# Patient Record
Sex: Female | Born: 1990 | Race: Black or African American | Hispanic: No | Marital: Single | Smoking: Never smoker
Health system: Southern US, Community
[De-identification: ages and names within clinical notes are randomized; demographics above are authoritative.]

## PROBLEM LIST (undated history)

## (undated) DIAGNOSIS — IMO0002 Reserved for concepts with insufficient information to code with codable children: Secondary | ICD-10-CM

## (undated) DIAGNOSIS — Z789 Other specified health status: Secondary | ICD-10-CM

## (undated) HISTORY — DX: Reserved for concepts with insufficient information to code with codable children: IMO0002

## (undated) HISTORY — PX: LEEP: SHX91

---
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
## 2011-07-23 ENCOUNTER — Emergency Department (HOSPITAL_COMMUNITY)
Admission: EM | Admit: 2011-07-23 | Discharge: 2011-07-23 | Disposition: A | Discharge status: Home / Self Care | Payer: BC Managed Care – PPO | Attending: Emergency Medicine | Admitting: Emergency Medicine

## 2011-07-23 DIAGNOSIS — N898 Other specified noninflammatory disorders of vagina: Secondary | ICD-10-CM | POA: Insufficient documentation

## 2011-07-23 DIAGNOSIS — R42 Dizziness and giddiness: Secondary | ICD-10-CM | POA: Insufficient documentation

## 2011-07-23 LAB — WET PREP, GENITAL
Trich, Wet Prep: NONE SEEN
WBC, Wet Prep HPF POC: NONE SEEN
Yeast Wet Prep HPF POC: NONE SEEN

## 2011-07-23 LAB — POCT I-STAT, CHEM 8
Calcium, Ion: 1.26 mmol/L (ref 1.12–1.32)
Chloride: 103 mEq/L (ref 96–112)
Glucose, Bld: 83 mg/dL (ref 70–99)
HCT: 39 % (ref 36.0–46.0)
Hemoglobin: 13.3 g/dL (ref 12.0–15.0)
TCO2: 25 mmol/L (ref 0–100)

## 2011-07-23 LAB — POCT PREGNANCY, URINE: Preg Test, Ur: NEGATIVE

## 2011-08-17 ENCOUNTER — Encounter (HOSPITAL_COMMUNITY): Payer: Self-pay | Admitting: *Deleted

## 2011-08-17 ENCOUNTER — Inpatient Hospital Stay (HOSPITAL_COMMUNITY)
Admission: AD | Admit: 2011-08-17 | Discharge: 2011-08-17 | Disposition: A | Discharge status: Home / Self Care | Payer: BC Managed Care – PPO | Source: Ambulatory Visit | Attending: Obstetrics & Gynecology | Admitting: Obstetrics & Gynecology

## 2011-08-17 DIAGNOSIS — N949 Unspecified condition associated with female genital organs and menstrual cycle: Secondary | ICD-10-CM | POA: Insufficient documentation

## 2011-08-17 DIAGNOSIS — N760 Acute vaginitis: Secondary | ICD-10-CM | POA: Insufficient documentation

## 2011-08-17 DIAGNOSIS — A499 Bacterial infection, unspecified: Secondary | ICD-10-CM

## 2011-08-17 DIAGNOSIS — B9689 Other specified bacterial agents as the cause of diseases classified elsewhere: Secondary | ICD-10-CM | POA: Insufficient documentation

## 2011-08-17 DIAGNOSIS — N939 Abnormal uterine and vaginal bleeding, unspecified: Secondary | ICD-10-CM

## 2011-08-17 DIAGNOSIS — N938 Other specified abnormal uterine and vaginal bleeding: Secondary | ICD-10-CM | POA: Insufficient documentation

## 2011-08-17 HISTORY — DX: Other specified health status: Z78.9

## 2011-08-17 LAB — CBC
HCT: 36.7 % (ref 36.0–46.0)
MCH: 26.8 pg (ref 26.0–34.0)
MCV: 83.4 fL (ref 78.0–100.0)
Platelets: 297 10*3/uL (ref 150–400)
RDW: 13.2 % (ref 11.5–15.5)
WBC: 6.5 10*3/uL (ref 4.0–10.5)

## 2011-08-17 LAB — WET PREP, GENITAL

## 2011-08-17 MED ORDER — NORGESTIMATE-ETH ESTRADIOL 0.25-35 MG-MCG PO TABS: 1.0000 | ORAL_TABLET | Freq: Every day | ORAL | Status: DC

## 2011-08-17 MED ORDER — METRONIDAZOLE 500 MG PO TABS: 500.0000 mg | ORAL_TABLET | Freq: Two times a day (BID) | ORAL | Status: AC

## 2011-08-17 NOTE — Plan of Care (Signed)
Patient is not in the lobby when called to triage.  

## 2011-08-17 NOTE — ED Provider Notes (Signed)
History     Chief Complaint  Patient presents with  . Vaginal Bleeding   HPI 20 y.o. G0P0 with vaginal bleeding x 3 months. Has Implanon since 2010, was having 2 cycles per year. Has occasional cramping, no pain today. Was evaluated for the same complaint 1 month ago at [facility].    Past Medical History  Diagnosis Date  . No pertinent past medical history     Past Surgical History  Procedure Date  . Leep     2008    No family history on file.  History  Substance Use Topics  . Smoking status: Never Smoker   . Smokeless tobacco: Not on file  . Alcohol Use: No    Allergies: No Known Allergies  No prescriptions prior to admission    Review of Systems  Constitutional: Negative.   Respiratory: Negative.   Cardiovascular: Negative.   Gastrointestinal: Negative for nausea, vomiting, abdominal pain, diarrhea and constipation.  Genitourinary: Negative for dysuria, urgency, frequency, hematuria and flank pain.       Negative for vaginal discharge, + for bleeding   Musculoskeletal: Negative.   Neurological: Negative.   Psychiatric/Behavioral: Negative.    Physical Exam   Blood pressure 120/67, pulse 82, temperature 98.7 F (37.1 C), temperature source Oral, resp. rate 16, height 5' 10.5" (1.791 m), weight 67.586 kg (149 lb), last menstrual period 05/16/2011, SpO2 99.00%.  Physical Exam  Constitutional: She is oriented to person, place, and time. She appears well-developed and well-nourished. No distress.  HENT:  Head: Normocephalic and atraumatic.  Cardiovascular: Normal rate, regular rhythm and normal heart sounds.   Respiratory: Effort normal and breath sounds normal. No respiratory distress.  GI: Soft. Bowel sounds are normal. She exhibits no distension and no mass. There is no tenderness. There is no rebound and no guarding.  Genitourinary: There is no rash or lesion on the right labia. There is no rash or lesion on the left labia. Uterus is not deviated, not enlarged,  not fixed and not tender. Cervix exhibits no motion tenderness, no discharge and no friability. Right adnexum displays no mass, no tenderness and no fullness. Left adnexum displays no mass, no tenderness and no fullness. There is bleeding (moderate) around the vagina. No erythema or tenderness around the vagina. Vaginal discharge (malodorous) found.  Neurological: She is alert and oriented to person, place, and time.  Skin: Skin is warm and dry.  Psychiatric: She has a normal mood and affect.    MAU Course  Procedures  Results for orders placed during the hospital encounter of 08/17/11 (from the past 24 hour(s))  CBC     Status: Abnormal   Collection Time   08/17/11  2:31 PM      Component Value Range   WBC 6.5  4.0 - 10.5 (K/uL)   RBC 4.40  3.87 - 5.11 (MIL/uL)   Hemoglobin 11.8 (*) 12.0 - 15.0 (g/dL)   HCT 16.1  09.6 - 04.5 (%)   MCV 83.4  78.0 - 100.0 (fL)   MCH 26.8  26.0 - 34.0 (pg)   MCHC 32.2  30.0 - 36.0 (g/dL)   RDW 40.9  81.1 - 91.4 (%)   Platelets 297  150 - 400 (K/uL)  WET PREP, GENITAL     Status: Abnormal   Collection Time   08/17/11  3:44 PM      Component Value Range   Yeast, Wet Prep NONE SEEN  NONE SEEN    Trich, Wet Prep NONE SEEN  NONE  SEEN    Clue Cells, Wet Prep MODERATE (*) NONE SEEN    WBC, Wet Prep HPF POC FEW (*) NONE SEEN      Assessment and Plan  20 y.o. G0P0  DUB - possibly related to Implanon - recommended f/u with [facility] (where she had implanon placed) to discuss possible change in birth control, discussed Mirena BV - rx Flagyl  Stryder Poitra 08/17/2011, 4:45 PM

## 2011-08-17 NOTE — Progress Notes (Signed)
Patient states she has had a Implanon since 2010. Has about 2 cycles per year. Started bleeding on 8-11 and has had bleeding every day since. Has started having off and on abdominal cramping, none at this time.

## 2011-08-20 NOTE — ED Provider Notes (Signed)
Agree with above note.  Suzanne Ober H. 08/20/2011 1:11 PM  

## 2011-12-02 ENCOUNTER — Other Ambulatory Visit: Payer: Self-pay | Admitting: Orthopedic Surgery

## 2011-12-02 DIAGNOSIS — M898X6 Other specified disorders of bone, lower leg: Secondary | ICD-10-CM

## 2011-12-03 ENCOUNTER — Ambulatory Visit
Admission: RE | Admit: 2011-12-03 | Discharge: 2011-12-03 | Disposition: A | Discharge status: Home / Self Care | Payer: BC Managed Care – PPO | Source: Ambulatory Visit | Attending: Orthopedic Surgery | Admitting: Orthopedic Surgery

## 2011-12-03 DIAGNOSIS — M898X6 Other specified disorders of bone, lower leg: Secondary | ICD-10-CM

## 2012-07-12 ENCOUNTER — Encounter: Payer: BC Managed Care – PPO | Admitting: Family Medicine

## 2012-07-13 ENCOUNTER — Encounter (HOSPITAL_COMMUNITY): Payer: Self-pay | Admitting: *Deleted

## 2012-07-13 ENCOUNTER — Inpatient Hospital Stay (HOSPITAL_COMMUNITY)
Admission: AD | Admit: 2012-07-13 | Discharge: 2012-07-13 | Disposition: A | Discharge status: Home / Self Care | Payer: BC Managed Care – PPO | Source: Ambulatory Visit | Attending: Obstetrics & Gynecology | Admitting: Obstetrics & Gynecology

## 2012-07-13 DIAGNOSIS — B379 Candidiasis, unspecified: Secondary | ICD-10-CM

## 2012-07-13 DIAGNOSIS — L293 Anogenital pruritus, unspecified: Secondary | ICD-10-CM | POA: Insufficient documentation

## 2012-07-13 DIAGNOSIS — IMO0002 Reserved for concepts with insufficient information to code with codable children: Secondary | ICD-10-CM

## 2012-07-13 DIAGNOSIS — B373 Candidiasis of vulva and vagina: Secondary | ICD-10-CM | POA: Insufficient documentation

## 2012-07-13 DIAGNOSIS — B9689 Other specified bacterial agents as the cause of diseases classified elsewhere: Secondary | ICD-10-CM | POA: Insufficient documentation

## 2012-07-13 DIAGNOSIS — B3731 Acute candidiasis of vulva and vagina: Secondary | ICD-10-CM | POA: Insufficient documentation

## 2012-07-13 DIAGNOSIS — A499 Bacterial infection, unspecified: Secondary | ICD-10-CM | POA: Insufficient documentation

## 2012-07-13 DIAGNOSIS — N760 Acute vaginitis: Secondary | ICD-10-CM | POA: Insufficient documentation

## 2012-07-13 LAB — WET PREP, GENITAL: Trich, Wet Prep: NONE SEEN

## 2012-07-13 MED ORDER — VALACYCLOVIR HCL 1 G PO TABS: 1000.0000 mg | ORAL_TABLET | Freq: Two times a day (BID) | ORAL | Status: AC

## 2012-07-13 MED ORDER — METRONIDAZOLE 500 MG PO TABS: 500.0000 mg | ORAL_TABLET | Freq: Two times a day (BID) | ORAL | Status: DC

## 2012-07-13 MED ORDER — HYDROCODONE-ACETAMINOPHEN 5-325 MG PO TABS: 1.0000 | ORAL_TABLET | ORAL | Status: DC | PRN

## 2012-07-13 MED ORDER — FLUCONAZOLE 150 MG PO TABS
150.0000 mg | ORAL_TABLET | Freq: Once | ORAL | Status: AC
  Administered 2012-07-13: 150 mg via ORAL
  Filled 2012-07-13: qty 1

## 2012-07-13 NOTE — MAU Provider Note (Signed)
History     CSN: [id number]  Arrival date and time: 07/13/12 7829   First Provider Initiated Contact with Patient 07/13/12 0102      Chief Complaint  Patient presents with  . Vaginal Itching   HPI Suzanne Barrett is a 21 y.o. female who presents to MAU with vaginal pain. The pain started 3 days ago. Patient noted lesions on buttocks and then spread to labia. Started with itching and now burning when voids. Regular visits @ [facility] office. Scheduled for Implanon removal next week. Taking OC's for abnormal bleeding. No new sex partners. Patient states that she changed from [employer] soap to a [employer] that someone gave her. The lesions started after that. The history was provided by the patient.  OB History    Grav Para Term Preterm Abortions TAB SAB Ect Mult Living   0               Past Medical History  Diagnosis Date  . No pertinent past medical history     Past Surgical History  Procedure Date  . Leep     2008    History reviewed. No pertinent family history.  History  Substance Use Topics  . Smoking status: Never Smoker   . Smokeless tobacco: Not on file  . Alcohol Use: No    Allergies: No Known Allergies  No prescriptions prior to admission    Review of Systems  Constitutional: Negative for fever, chills and weight loss.  HENT: Negative for ear pain, nosebleeds, congestion, sore throat and neck pain.   Eyes: Negative for blurred vision, double vision, photophobia and pain.  Respiratory: Negative for cough, shortness of breath and wheezing.   Cardiovascular: Negative for chest pain, palpitations and leg swelling.  Gastrointestinal: Negative for heartburn, nausea, vomiting, abdominal pain, diarrhea and constipation.  Genitourinary: Positive for dysuria. Negative for urgency and frequency.       Genital lesions  Musculoskeletal: Negative for myalgias and back pain.  Skin: Negative for itching and rash.  Neurological: Negative for dizziness,  sensory change, speech change, seizures, weakness and headaches.  Endo/Heme/Allergies: Does not bruise/bleed easily.  Psychiatric/Behavioral: Negative for depression. The patient is not nervous/anxious.    Physical Exam   Blood pressure 121/67, pulse 88, temperature 99 F (37.2 C), temperature source Oral, resp. rate 16, height 5\' 9"  (1.753 m), weight 163 lb (73.936 kg), last menstrual period 11/22/2011, SpO2 100.00%.  Physical Exam  Nursing note and vitals reviewed. Constitutional: She is oriented to person, place, and time. She appears well-developed and well-nourished. No distress.  HENT:  Head: Normocephalic and atraumatic.  Eyes: EOM are normal.  Neck: Neck supple.  Cardiovascular: Normal rate.   Respiratory: Effort normal.  GI: Soft. There is no tenderness.  Genitourinary:       External genitalia with lesions noted on mucous membrane of left labia minor and folds of buttocks. Thick white malodorous vaginal discharge.  Musculoskeletal: Normal range of motion.  Neurological: She is alert and oriented to person, place, and time.  Skin: Skin is warm and dry.  Psychiatric: She has a normal mood and affect. Her behavior is normal. Judgment and thought content normal.   Wet prep = Moderate Clue cells   Assessment: 21 y.o. 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 She will return if any problems arise.   Medication List     As of 07/18/2012  1:17 AM    START taking these medications         HYDROcodone-acetaminophen 5-325 MG per tablet   Commonly known as: NORCO/VICODIN   Take 1 tablet by mouth every 4 (four) hours as needed for pain.      metroNIDAZOLE 500 MG tablet   Commonly known as: FLAGYL   Take 1  tablet (500 mg total) by mouth 2 (two) times daily.      valACYclovir 1000 MG tablet   Commonly known as: VALTREX   Take 1 tablet (1,000 mg total) by mouth 2 (two) times daily.      CONTINUE taking these medications         doxycycline 40 MG capsule   Commonly known as: ORACEA      norgestimate-ethinyl estradiol 0.25-35 MG-MCG tablet   Commonly known as: ORTHO-CYCLEN,SPRINTEC,PREVIFEM   Take 1 tablet by mouth daily. 1 tab tid x 3 days, then 1 tab bid x 3 days, then 1 po daily      ZICAM COLD REMEDY PO          Where to get your medications    These are the prescriptions that you need to pick up.   You may get these medications from any pharmacy.         HYDROcodone-acetaminophen 5-325 MG per tablet   metroNIDAZOLE 500 MG tablet   valACYclovir 1000 MG tablet           Letrell Attwood, RN, FNP, BC 07/18/2012, 1:10 AM

## 2012-07-13 NOTE — MAU Note (Signed)
Pt reports her genital area is very irritated and itchy, states she has now noticed some "bumps", very painful to touch.denies discharge

## 2012-07-13 NOTE — MAU Note (Signed)
PT SAYS SHE IS USING 2 TYPES OF BC- TO GET HER VAG BLEEDING UNDER CONTROL-   GETS CARE AT CLINIC BUT HAS AN APPOINTMENT AT [facility]  ON 07-19-2012.Marland Kitchen TAKES DOXYCYCLINE FOR BV.    PT NOTICED  BUMPS  ON BUTTOCKS CHEEK ON  Sunday AND IT SPREAD TO LABIA-  HAS BEEN USING  VICTORIA SECRET BATH WASH- NEW.   SAYS  ALL ITCHES AND BURNS WITH  URINATION.- NO BLEEDING-  NOT SCRATCHING IT.

## 2012-07-14 LAB — HERPES SIMPLEX VIRUS CULTURE
Culture: DETECTED
Special Requests: NORMAL

## 2012-07-14 LAB — GC/CHLAMYDIA PROBE AMP, GENITAL: Chlamydia, DNA Probe: NEGATIVE

## 2012-07-15 ENCOUNTER — Telehealth: Payer: Self-pay | Admitting: Advanced Practice Midwife

## 2012-07-15 NOTE — Telephone Encounter (Signed)
Called pt to inform her of positive HSV 1 culture on 07/13/12 in MAU.  Pt taking Valtrex prescribed for her at that visit in MAU. Pt given opportunity to ask questions and questions answered.

## 2012-07-19 ENCOUNTER — Encounter: Payer: BC Managed Care – PPO | Admitting: Obstetrics & Gynecology

## 2012-08-19 ENCOUNTER — Ambulatory Visit (INDEPENDENT_AMBULATORY_CARE_PROVIDER_SITE_OTHER): Payer: BC Managed Care – PPO | Admitting: Obstetrics & Gynecology

## 2012-08-19 ENCOUNTER — Encounter: Payer: Self-pay | Admitting: Obstetrics & Gynecology

## 2012-08-19 VITALS — BP 113/95 | HR 79 | Ht 68.0 in | Wt 162.0 lb

## 2012-08-19 DIAGNOSIS — A499 Bacterial infection, unspecified: Secondary | ICD-10-CM

## 2012-08-19 DIAGNOSIS — B9689 Other specified bacterial agents as the cause of diseases classified elsewhere: Secondary | ICD-10-CM

## 2012-08-19 DIAGNOSIS — Z30430 Encounter for insertion of intrauterine contraceptive device: Secondary | ICD-10-CM

## 2012-08-19 DIAGNOSIS — Z3046 Encounter for surveillance of implantable subdermal contraceptive: Secondary | ICD-10-CM

## 2012-08-19 DIAGNOSIS — Z3009 Encounter for other general counseling and advice on contraception: Secondary | ICD-10-CM

## 2012-08-19 DIAGNOSIS — N760 Acute vaginitis: Secondary | ICD-10-CM

## 2012-08-19 MED ORDER — LEVONORGESTREL 20 MCG/24HR IU IUD: 1.0000 | INTRAUTERINE_SYSTEM | Freq: Once | INTRAUTERINE | Status: DC

## 2012-08-19 NOTE — Progress Notes (Signed)
Patient is here to have her Implanon removed, this was placed 08/08/09.  She is interested in having a Mirena or Skyla placed. Patient has taken cytotec prior to arriving.

## 2012-08-19 NOTE — Progress Notes (Signed)
Suzanne Barrett is a 21 y.o. G0P0 here for Mirena IUD insertion and Implanon removal. No GYN concerns. .  IUD Procedure Note Patient identified, informed consent performed.  Discussed risks of irregular bleeding, cramping, infection, malpositioning or misplacement of the IUD outside the uterus which may require further procedures. Time out was performed.  Urine pregnancy test negative.  Speculum placed in the vagina. Copious thick, white discharge seen, wet prep obtained.  Cervix visualized.  Cleaned with Betadine x 2.   Grasped anteriorly with a single tooth tenaculum.  Dilated cervix with plastic dilators to accomodate the Mirena apparatus.  Uterus sounded to 8 cm.  Mirena IUD placed per manufacturer's recommendations, there was some difficulty given nulliparous cervix and uterus.  Strings trimmed to 3 cm. Tenaculum was removed, good hemostasis noted.  Patient had a lot of cramping; bedside ultrasound confirmed that IUD was in the endometrial cavity.  She was given Ibuprofen 800mg  po x 1, and post-procedure instructions.   Implanon Removal Patient given informed consent for removal of her Implanon.  Appropriate time out taken. Nexplanon site identified.  Area prepped in usual sterile fashon. 3 ml of 1% lidocaine was used to anesthetize the area at the distal end of the implant. A small stab incision was made right beside the implant on the distal portion.  The Nexplanon rod was grasped using hemostats and removed with some difficulty secondary to scar tissue around the Nexplanon.  There was small amount blood loss. There were no complications.  A small amount of antibiotic ointment and steri-strips were applied over the small incision.  A pressure bandage was applied to reduce any bruising.  The patient tolerated the procedure well and was given post procedure instructions.  Will follow up wet prep results and manage accordingly. Patient will follow up in 4 weeks for IUD check and for annual exam.  She was  given information about Gardasil vaccination.

## 2012-08-19 NOTE — Patient Instructions (Signed)
Intrauterine Device Insertion Care After Refer to this sheet in the next few weeks. These instructions provide you with information on caring for yourself after your procedure. Your caregiver may also give you more specific instructions. Your treatment has been planned according to current medical practices, but problems sometimes occur. Call your caregiver if you have any problems or questions after your procedure. HOME CARE INSTRUCTIONS   Only take over-the-counter or prescription medicines for pain, discomfort, or fever as directed by your caregiver. Do not use aspirin. This may increase bleeding.  Check your IUD to make sure it is in place before you resume sexual activity. You should be able to feel the strings. If you cannot feel the strings, something may be wrong. The IUD may have fallen out of the uterus, or the uterus may have been punctured (perforated) during placement. Also, if the strings are getting longer, it may mean that the IUD is being forced out of the uterus. You no longer have full protection from pregnancy if any of these problems occur.  You may resume sexual intercourse if you are not having problems with the IUD. The IUD is considered immediately effective.  You may resume normal activities.  Keep all follow-up appointments to be sure your IUD has remained in place. After the first exam, yearly exams are advised, unless you cannot feel the strings of your IUD.  Continue to check that the IUD is still in place by feeling for the strings after every menstrual period. SEEK MEDICAL CARE IF:   You have bleeding that is heavier or lasts longer than a normal menstrual cycle.  You have a fever.  You have increasing cramps or abdominal pain not relieved with medicine.  You have abdominal pain that does not seem to be related to the same area of earlier cramping and pain.  You are lightheaded, unusually weak, or faint.  You have abnormal vaginal discharge or  smells.  You have pain during sexual intercourse.  You cannot feel the IUD strings, or the IUD string has gotten longer.  You feel the IUD at the opening of the cervix in the vagina.  You think you are pregnant, or you miss your menstrual period.  The IUD string is hurting your sex partner. Document Released: 05/20/2011 Document Revised: 12/14/2011 Document Reviewed: 05/20/2011 Exit[employer] Patient Information 2013 [employer].  

## 2012-08-20 LAB — WET PREP, GENITAL: Yeast Wet Prep HPF POC: NONE SEEN

## 2012-08-22 MED ORDER — TINIDAZOLE 500 MG PO TABS: 2.0000 g | ORAL_TABLET | Freq: Every day | ORAL | Status: AC

## 2012-08-22 NOTE — Addendum Note (Signed)
Addended by: Jaynie Collins A on: 08/22/2012 02:45 PM   Modules accepted: Orders

## 2012-09-15 ENCOUNTER — Encounter: Payer: BC Managed Care – PPO | Admitting: Family Medicine

## 2012-09-15 NOTE — Progress Notes (Signed)
This encounter was created in error - please disregard.

## 2012-11-14 ENCOUNTER — Encounter: Payer: Self-pay | Admitting: Obstetrics & Gynecology

## 2012-11-14 ENCOUNTER — Ambulatory Visit (INDEPENDENT_AMBULATORY_CARE_PROVIDER_SITE_OTHER): Payer: BC Managed Care – PPO | Admitting: Obstetrics & Gynecology

## 2012-11-14 VITALS — BP 115/72 | HR 84 | Ht 68.0 in | Wt 169.0 lb

## 2012-11-14 DIAGNOSIS — N760 Acute vaginitis: Secondary | ICD-10-CM

## 2012-11-14 DIAGNOSIS — B9689 Other specified bacterial agents as the cause of diseases classified elsewhere: Secondary | ICD-10-CM

## 2012-11-14 DIAGNOSIS — A499 Bacterial infection, unspecified: Secondary | ICD-10-CM

## 2012-11-14 DIAGNOSIS — N898 Other specified noninflammatory disorders of vagina: Secondary | ICD-10-CM

## 2012-11-14 DIAGNOSIS — Z23 Encounter for immunization: Secondary | ICD-10-CM

## 2012-11-14 NOTE — Progress Notes (Signed)
History:  22 y.o. G0P0 here today for report of foul smelling vaginal discharge for a few weeks. No pruritus.  Thinks it is recurrent BV. On review of her records, she had positive tests for BV twice last year, last in 08/2012 and 07/2012 (also in 07/2011 and 07/2011).  Last episode was treated with Tinidazole.  She denies any other symptoms.  The following portions of the patient's history were reviewed and updated as appropriate: allergies, current medications, past family history, past medical history, past social history, past surgical history and problem list.  Review of Systems:  A comprehensive review of systems was negative.  Objective:  Physical Exam BP 115/72  Pulse 84  Ht 5\' 8"  (1.727 m)  Wt 169 lb (76.658 kg)  BMI 25.7 kg/m2  LMP 10/14/2012 Gen: NAD Abd: Soft, nontender and nondistended Pelvic: Normal appearing external genitalia; normal appearing vaginal mucosa and cervix.  Foul smelling yellow discharge seen, sample obtained.  Small uterus, no other palpable masses, no uterine or adnexal tenderness  Assessment & Plan:  Will follow up results and manage accordingly.  If recurrent BV, will need prolonged therapy for eradication.   Lab Addendum 11/05/2012 Wet prep showed moderate clue cells.  Patient has recurrent bacterial vaginosis.  Prescribed Metronidazole 500 mg po daily x 7 days, followed by 0.75% Metronidazole gel nightly x 10 days then twice a week for 6 months. This is is the recommended therapy for suppression of bacterial vaginosis. Prolonged oral therapy is not recommended because of concerns about toxicity.  A potential side effect of the prolonged therapy is vaginal candidiasis (yeast infection) so single dose Diflucan 150 mg was prescribed with three refills.  Will monitor response.

## 2012-11-15 LAB — WET PREP, GENITAL: Yeast Wet Prep HPF POC: NONE SEEN

## 2012-11-15 MED ORDER — FLUCONAZOLE 150 MG PO TABS: 150.0000 mg | ORAL_TABLET | Freq: Once | ORAL | Status: AC

## 2012-11-15 MED ORDER — METRONIDAZOLE 500 MG PO TABS: 500.0000 mg | ORAL_TABLET | Freq: Two times a day (BID) | ORAL | Status: AC

## 2012-11-15 MED ORDER — METRONIDAZOLE 0.75 % VA GEL: 1.0000 | Freq: Every day | VAGINAL | Status: AC

## 2012-11-15 NOTE — Patient Instructions (Signed)
Return to clinic for any scheduled appointments or for any gynecologic concerns as needed.   

## 2013-01-12 ENCOUNTER — Ambulatory Visit: Payer: BC Managed Care – PPO | Admitting: *Deleted

## 2013-08-27 ENCOUNTER — Other Ambulatory Visit: Payer: Self-pay

## 2013-08-27 ENCOUNTER — Emergency Department (HOSPITAL_COMMUNITY): Payer: BC Managed Care – PPO

## 2013-08-27 ENCOUNTER — Emergency Department (HOSPITAL_COMMUNITY)
Admission: EM | Admit: 2013-08-27 | Discharge: 2013-08-27 | Disposition: A | Discharge status: Home / Self Care | Payer: BC Managed Care – PPO | Attending: Emergency Medicine | Admitting: Emergency Medicine

## 2013-08-27 ENCOUNTER — Encounter (HOSPITAL_COMMUNITY): Payer: Self-pay | Admitting: Emergency Medicine

## 2013-08-27 DIAGNOSIS — R0602 Shortness of breath: Secondary | ICD-10-CM | POA: Insufficient documentation

## 2013-08-27 DIAGNOSIS — R0789 Other chest pain: Secondary | ICD-10-CM | POA: Insufficient documentation

## 2013-08-27 DIAGNOSIS — Z8659 Personal history of other mental and behavioral disorders: Secondary | ICD-10-CM | POA: Insufficient documentation

## 2013-08-27 DIAGNOSIS — Z87891 Personal history of nicotine dependence: Secondary | ICD-10-CM | POA: Insufficient documentation

## 2013-08-27 DIAGNOSIS — Z79899 Other long term (current) drug therapy: Secondary | ICD-10-CM | POA: Insufficient documentation

## 2013-08-27 DIAGNOSIS — Z792 Long term (current) use of antibiotics: Secondary | ICD-10-CM | POA: Insufficient documentation

## 2013-08-27 MED ORDER — ALBUTEROL SULFATE (5 MG/ML) 0.5% IN NEBU
2.5000 mg | INHALATION_SOLUTION | Freq: Once | RESPIRATORY_TRACT | Status: AC
  Administered 2013-08-27: 2.5 mg via RESPIRATORY_TRACT
  Filled 2013-08-27: qty 0.5

## 2013-08-27 MED ORDER — IPRATROPIUM BROMIDE 0.02 % IN SOLN
0.5000 mg | Freq: Once | RESPIRATORY_TRACT | Status: AC
  Administered 2013-08-27: 0.5 mg via RESPIRATORY_TRACT
  Filled 2013-08-27: qty 2.5

## 2013-08-27 NOTE — ED Provider Notes (Signed)
CSN: [id number]     Arrival date & time 08/27/13  2208 History  This chart was scribed for non-physician practitioner Antony Madura, PA-C working with Hurman Horn, MD by Joaquin Music, ED Scribe. This patient was seen in room WTR8/WTR8 and the patient's care was started at  10:35 PM  Chief Complaint  Patient presents with  . Shortness of Breath   Patient is a 22 y.o. female presenting with shortness of breath. The history is provided by the patient. No language interpreter was used.  Shortness of Breath Severity:  Severe Onset quality:  Sudden Duration:  1 hour Timing:  Constant Progression:  Worsening Chronicity:  Recurrent (last episode 1 yr ago) Context: not activity   Relieved by:  Nothing Worsened by:  Nothing tried Ineffective treatments: Tried breathing in a bag. Associated symptoms: no abdominal pain, no chest pain, no cough, no fever and no vomiting   Risk factors: no recent surgery    HPI Comments: Suzanne Barrett is a 22 y.o. female who presents to the Emergency Department complaining of ongoing, worsening SOB with associated chest tightness for the past hour. Pt states she was watching tv and began having SOB. She states she had these symptoms 1 year ago which were attributed to "anxiety attacks". Pt states she generally tries breathing in a bag, but was unable to get relief from this today. Pt denies any recent surgeries/hospitalizations or prolonged travel. Pt denies fever, syncope, CP, nausea, vomiting, abdominal pain, numbness, and tingling. Pt states she does not have a PCP.  Past Medical History  Diagnosis Date  . No pertinent past medical history   . Abnormal pap 2009   Past Surgical History  Procedure Laterality Date  . Leep      2008  . Leep  2009    abnormal pap   Family History  Problem Relation Age of Onset  . Hypertension Mother   . Thyroid disease Mother   . Stroke Maternal Aunt   . Diabetes Maternal Grandmother   . Heart disease Maternal  Grandfather    History  Substance Use Topics  . Smoking status: Never Smoker   . Smokeless tobacco: Former Neurosurgeon    Quit date: 10/06/2011  . Alcohol Use: No   OB History   Grav Para Term Preterm Abortions TAB SAB Ect Mult Living   0              Review of Systems  Constitutional: Negative for fever.  Respiratory: Positive for shortness of breath. Negative for cough.   Cardiovascular: Negative for chest pain.  Gastrointestinal: Negative for vomiting and abdominal pain.  All other systems reviewed and are negative.   Allergies  Review of patient's allergies indicates no known allergies.  Home Medications   Current Outpatient Rx  Name  Route  Sig  Dispense  Refill  . fluconazole (DIFLUCAN) 150 MG tablet   Oral   Take 1 tablet (150 mg total) by mouth once.   1 tablet   3   . Homeopathic Products (ZICAM COLD REMEDY PO)   Oral   Take 1 capsule by mouth daily as needed. Patient was taking this medication for a cold.          . metroNIDAZOLE (METROGEL) 0.75 % vaginal gel   Vaginal   Place 1 Applicatorful vaginally at bedtime. Apply one applicatorful to vagina at bedtime for 10 days, then twice a week for 6 months.   70 g   5   . tinidazole (  TINDAMAX) 500 MG tablet   Oral   Take 4 tablets (2,000 mg total) by mouth daily with breakfast. For two days   8 tablet   2   . valACYclovir (VALTREX) 500 MG tablet   Oral   Take 500 mg by mouth 2 (two) times daily.          Triage Vitals:BP 115/66  Pulse 71  Temp(Src) 98 F (36.7 C) (Oral)  Resp 16  SpO2 99%  Physical Exam  Nursing note and vitals reviewed. Constitutional: She is oriented to person, place, and time. She appears well-developed and well-nourished. No distress.  HENT:  Head: Normocephalic and atraumatic.  Mouth/Throat: Oropharynx is clear and moist. No oropharyngeal exudate.  Airway patent.  Eyes: Conjunctivae and EOM are normal. Pupils are equal, round, and reactive to light. No scleral icterus.   Neck: Normal range of motion. Neck supple. No tracheal deviation present.  No stridor  Cardiovascular: Normal rate, regular rhythm and normal heart sounds.   Pulmonary/Chest: Effort normal and breath sounds normal. No respiratory distress. She has no wheezes. She has no rales.  Lungs clear to auscultation bilaterally. No tachypnea, retractions, or accessory muscle use.  Abdominal: Soft. She exhibits no distension. There is no tenderness. There is no rebound and no guarding.  Musculoskeletal: Normal range of motion.  Neurological: She is alert and oriented to person, place, and time.  Skin: Skin is warm and dry. No rash noted. She is not diaphoretic. No erythema. No pallor.  Psychiatric: She has a normal mood and affect. Her behavior is normal.   ED Course  Procedures  DIAGNOSTIC STUDIES: Oxygen Saturation is 99% on RA, normal by my interpretation.    COORDINATION OF CARE: 10:38 PM-Discussed treatment plan which includes EKG, CXR, and albuterol tx while in ED. Pt agreed to plan.   Labs Review Labs Reviewed - No data to display Imaging Review Dg Chest 2 View  08/27/2013   CLINICAL DATA:  Shortness of breath.  Chest tightness.  EXAM: CHEST  2 VIEW  COMPARISON:  None.  FINDINGS: The heart size and mediastinal contours are within normal limits. Both lungs are clear. The visualized skeletal structures are unremarkable.  IMPRESSION: No active cardiopulmonary disease.   Electronically Signed   By: Myles Rosenthal M.D.   On: 08/27/2013 23:13   EKG: 27-Aug-2013 22:55:50  [facility] ED ROUTINE RECORD Sinus rhythm Borderline short PR interval RSR' in V1 or V2, probably normal variant No previous ECGs available Vent. rate 77 BPM PR interval 113 ms QRS duration 79 ms QT/QTc 367/415 ms P-R-T axes 79 80 43 Confirmed by BEDNAR MD, JOHN ([id number]) on 08/27/2013 11:05:48 PM  MDM   1. Shortness of breath    Patient presents for shortness of breath x1 hour. She endorses a similar  episode one year ago which was attributed to an anxiety attack. Patient well and nontoxic appearing on arrival without tachypnea, dyspnea, or hypoxia. Patient afebrile and hemodynamically stable. Lungs clear to auscultation bilaterally. No stridor appreciated. No retractions or accessory muscle use; symmetric chest expansion. Chest x-ray today negative for acute changes. EKG unremarkable. Doubt pulmonary embolism in this patient without tachycardia or hypoxia; she is PERC negative. Symptoms treated with albuterol and Atrovent nebulizer with improvement in shortness of breath. She is stable for discharge with primary care followup. Return precautions discussed and patient agreeable to plan with no unaddressed concerns.  I personally performed the services described in this documentation, which was scribed in my presence. The recorded  information has been reviewed and is accurate.     Antony Madura, PA-C 08/28/13 [phone]

## 2013-08-27 NOTE — ED Notes (Signed)
Pt hyperventilating in the lobby

## 2013-08-27 NOTE — ED Notes (Signed)
Pt states she has SOB that began 1 hours ago. Pt has no hx of asthma or resp problems.

## 2013-08-30 NOTE — ED Provider Notes (Signed)
Medical screening examination/treatment/procedure(s) were performed by non-physician practitioner and as supervising physician I was immediately available for consultation/collaboration.  Shamela Haydon M Estanislado Surgeon, MD 08/30/13 1609 

## 2013-10-07 ENCOUNTER — Ambulatory Visit (INDEPENDENT_AMBULATORY_CARE_PROVIDER_SITE_OTHER): Payer: BC Managed Care – PPO | Admitting: Family Medicine

## 2013-10-07 ENCOUNTER — Encounter (HOSPITAL_COMMUNITY): Payer: Self-pay | Admitting: Emergency Medicine

## 2013-10-07 ENCOUNTER — Emergency Department (HOSPITAL_COMMUNITY)
Admission: EM | Admit: 2013-10-07 | Discharge: 2013-10-07 | Disposition: A | Discharge status: Home / Self Care | Payer: BC Managed Care – PPO | Attending: Emergency Medicine | Admitting: Emergency Medicine

## 2013-10-07 VITALS — BP 126/78 | HR 112 | Temp 99.0°F | Resp 17 | Ht 69.0 in | Wt 157.0 lb

## 2013-10-07 DIAGNOSIS — D649 Anemia, unspecified: Secondary | ICD-10-CM

## 2013-10-07 DIAGNOSIS — Z0000 Encounter for general adult medical examination without abnormal findings: Secondary | ICD-10-CM

## 2013-10-07 DIAGNOSIS — Z52008 Unspecified donor, other blood: Secondary | ICD-10-CM

## 2013-10-07 DIAGNOSIS — Z008 Encounter for other general examination: Secondary | ICD-10-CM | POA: Insufficient documentation

## 2013-10-07 LAB — POCT CBC
Granulocyte percent: 70.6 %G (ref 37–80)
HCT, POC: 38.4 % (ref 37.7–47.9)
HEMOGLOBIN: 11.8 g/dL — AB (ref 12.2–16.2)
Lymph, poc: 1.7 (ref 0.6–3.4)
MCH: 27.1 pg (ref 27–31.2)
MCHC: 30.7 g/dL — AB (ref 31.8–35.4)
MCV: 88.2 fL (ref 80–97)
MID (cbc): 0.3 (ref 0–0.9)
MPV: 9.2 fL (ref 0–99.8)
POC Granulocyte: 4.8 (ref 2–6.9)
POC LYMPH PERCENT: 25.1 %L (ref 10–50)
POC MID %: 4.3 % (ref 0–12)
Platelet Count, POC: 259 10*3/uL (ref 142–424)
RBC: 4.35 M/uL (ref 4.04–5.48)
RDW, POC: 14.2 %
WBC: 6.8 10*3/uL (ref 4.6–10.2)

## 2013-10-07 NOTE — Addendum Note (Signed)
Addended by: Ihor DowBYRD, Denzel Etienne M on: 10/07/2013 04:42 PM   Modules accepted: Orders

## 2013-10-07 NOTE — Patient Instructions (Signed)
Patient is to return in 3 months, before January 05 2014, to have her lab rechecked again. She is instructed to take multivitamin 1 daily she's been doing, and one iron pill a pill daily.

## 2013-10-07 NOTE — ED Provider Notes (Signed)
CSN: [id number]     Arrival date & time 10/07/13  1237 History  This chart was scribed for non-physician practitioner, Junius Finner, PA-C working with Dagmar Hait, MD by Greggory Stallion, ED scribe. This patient was seen in room TR06C/TR06C and the patient's care was started at 1:27 PM.   Chief Complaint  Patient presents with  . Labs Only   The history is provided by the patient. No language interpreter was used.   HPI Comments: Suzanne Barrett is a 23 y.o. female who presents to the Emergency Department asking for labs. She states she needs H&H done so that she can donate plasma. Pt states they couldn't do any labs on her before she saw a physician to ensure her levels were okay. She states she tried to go to [facility] but was told they couldn't do it there. Denies having any current complaints.   Past Medical History  Diagnosis Date  . No pertinent past medical history   . Abnormal pap 2009   Past Surgical History  Procedure Laterality Date  . Leep      2008  . Leep  2009    abnormal pap   Family History  Problem Relation Age of Onset  . Hypertension Mother   . Thyroid disease Mother   . Stroke Maternal Aunt   . Diabetes Maternal Grandmother   . Heart disease Maternal Grandfather    History  Substance Use Topics  . Smoking status: Never Smoker   . Smokeless tobacco: Former Neurosurgeon    Quit date: 10/06/2011  . Alcohol Use: No   OB History   Grav Para Term Preterm Abortions TAB SAB Ect Mult Living   0              Review of Systems  All other systems reviewed and are negative.    Allergies  Review of patient's allergies indicates no known allergies.  Home Medications   Current Outpatient Rx  Name  Route  Sig  Dispense  Refill  . fluconazole (DIFLUCAN) 150 MG tablet   Oral   Take 1 tablet (150 mg total) by mouth once.   1 tablet   3   . Homeopathic Products (ZICAM COLD REMEDY PO)   Oral   Take 1 capsule by mouth daily as needed. Patient was  taking this medication for a cold.          . metroNIDAZOLE (METROGEL) 0.75 % vaginal gel   Vaginal   Place 1 Applicatorful vaginally at bedtime. Apply one applicatorful to vagina at bedtime for 10 days, then twice a week for 6 months.   70 g   5   . tinidazole (TINDAMAX) 500 MG tablet   Oral   Take 4 tablets (2,000 mg total) by mouth daily with breakfast. For two days   8 tablet   2   . valACYclovir (VALTREX) 500 MG tablet   Oral   Take 500 mg by mouth 2 (two) times daily.          BP 145/95  Pulse 96  Temp(Src) 98 F (36.7 C) (Oral)  Resp 16  Wt 160 lb (72.576 kg)  SpO2 100%  Physical Exam  Nursing note and vitals reviewed. Constitutional: She appears well-developed and well-nourished. No distress.  Pt appears well, non-toxic. NAD.  HENT:  Head: Normocephalic and atraumatic.  Eyes: EOM are normal.  Neck: Normal range of motion.  Cardiovascular: Normal rate.   Pulmonary/Chest: Effort normal. No respiratory distress.  Musculoskeletal: Normal range of motion.  Neurological: She is alert.  Skin: Skin is warm and dry.  Psychiatric: She has a normal mood and affect. Her behavior is normal.    ED Course  Procedures (including critical care time)  DIAGNOSTIC STUDIES: Oxygen Saturation is 100% on RA, normal by my interpretation.    COORDINATION OF CARE: 1:29 PM-Discussed treatment plan which [facility] with pt at bedside and pt agreed to plan.   Labs Review Labs Reviewed - No data to display Imaging Review No results found.  EKG Interpretation   None       MDM   1. Well adult health check    Pt appears well, NAD. Vitals: unremarkable. No complaints at this time.  Pt requesting form be completed and H/H drawn so she can donate plasma. Discussed with pt this needs to be completed by a PCP and there is no indication for emergent screen of H/H at this time.  Advised to f[facility]. Return  precautions provided. Pt verbalized understanding and agreement with tx plan.  I personally performed the services described in this documentation, which was scribed in my presence. The recorded information has been reviewed and is accurate.   Junius Finnerrin O'Malley, PA-C 10/07/13 1359

## 2013-10-07 NOTE — Discharge Instructions (Signed)
Normal Exam, Adult  You were seen and examined today in our facility. Our caregiver found nothing wrong on the exam. If testing was done such as lab work or x-rays, they did not indicate enough wrong to suggest that treatment should be given. The caregiver then must decide after testing is finished if your concern is a physical problem or illness that needs treatment. Today no treatable problem was found. Even if reassurance was given, if you feel you are getting worse when you get home make sure you call back or return to our department.  For the protection of your privacy, test results can not be given over the phone. Make sure you receive the results of your test. Ask as to how these results are to be obtained if you have not been informed. It is your responsibility to obtain your test results.  Your condition can change over time. Sometimes it takes more than one visit to determine the cause of your symptoms. It is important that you monitor your condition for any changes.  SEEK IMMEDIATE MEDICAL CARE IF:  · You develop an oral temperature above 102° F (38.9° C), which lasts for more than 2 days, not controlled by medications. Only take over-the-counter or prescription medicines for pain, discomfort, or fever as directed by your caregiver.  · You develop a loss of appetite or start throwing up (vomiting).  · You develop a rash, cough, belly (abdominal) pain, earache, headache, or develop pain in neck, muscles, or joints.  · The problem or problems which brought you to our facility become worse or are a cause of more concern.  If we have told you today your exam and tests are normal, and a short while later you feel this is not right, please seek medical attention so you may be rechecked.  Document Released: 01/03/2001 Document Revised: 12/14/2011 Document Reviewed: 04/27/2008  [employer]® Patient Information ©2014 [employer].

## 2013-10-07 NOTE — Progress Notes (Signed)
Subjective: Pleasant 23 year old lady who is here for her blood to get checked. She has a history of anemia stopping her from being able to be a regular plasma donor. She does not have much in the way of periods because she has a Civil [employer] fast streamerMirena. 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  Objective: No acute distress. Throat clear. Neck supple without nodes or thyromegaly. Chest is clear to auscultation. Heart regular without murmurs. Abdomen soft nontender.  Assessment: History of anemia  Plan: Since she's been a regular donor or check a CBC and competent metabolic panel  Results for orders placed in visit on 10/07/13  POCT CBC      Result Value Range   WBC 6.8  4.6 - 10.2 K/uL   Lymph, poc 1.7  0.6 - 3.4   POC LYMPH PERCENT 25.1  10 - 50 %L   MID (cbc) 0.3  0 - 0.9   POC MID % 4.3  0 - 12 %M   POC Granulocyte 4.8  2 - 6.9   Granulocyte percent 70.6  37 - 80 %G   RBC 4.35  4.04 - 5.48 M/uL   Hemoglobin 11.8 (*) 12.2 - 16.2 g/dL   HCT, POC 78.238.4  95.637.7 - 47.9 %   MCV 88.2  80 - 97 fL   MCH, POC 27.1  27 - 31.2 pg   MCHC 30.7 (*) 31.8 - 35.4 g/dL   RDW, POC 21.314.2     Platelet Count, POC 259  142 - 424 K/uL   MPV 9.2  0 - 99.8 fL   I think she can safely give the plasma, but will require that we recheck this in 3 months after she's been taking and OTC iron daily.

## 2013-10-07 NOTE — ED Notes (Signed)
Pt instructed to follow up and establish primary care so she could get her blood work drawn. Pt verbalized understanding.

## 2013-10-07 NOTE — ED Notes (Signed)
Pt reports needing H&H done so that she could donate plasma. No distress noted at triage.

## 2013-10-07 NOTE — ED Notes (Signed)
Pt needs blood work drawn so she can donate plasma. Pt has no symptoms, and no pain.

## 2013-10-07 NOTE — ED Provider Notes (Signed)
Medical screening examination/treatment/procedure(s) were performed by non-physician practitioner and as supervising physician I was immediately available for consultation/collaboration.  EKG Interpretation   None         William Jakarie Pember, MD 10/07/13 1542 

## 2013-10-08 ENCOUNTER — Encounter: Payer: Self-pay | Admitting: Family Medicine

## 2013-10-08 LAB — COMPREHENSIVE METABOLIC PANEL
ALT: 10 U/L (ref 0–35)
AST: 16 U/L (ref 0–37)
Albumin: 4.7 g/dL (ref 3.5–5.2)
Alkaline Phosphatase: 58 U/L (ref 39–117)
BUN: 11 mg/dL (ref 6–23)
CO2: 28 meq/L (ref 19–32)
CREATININE: 0.73 mg/dL (ref 0.50–1.10)
Calcium: 10.2 mg/dL (ref 8.4–10.5)
Chloride: 102 mEq/L (ref 96–112)
Glucose, Bld: 101 mg/dL — ABNORMAL HIGH (ref 70–99)
Potassium: 3.7 mEq/L (ref 3.5–5.3)
SODIUM: 139 meq/L (ref 135–145)
TOTAL PROTEIN: 7.5 g/dL (ref 6.0–8.3)
Total Bilirubin: 0.4 mg/dL (ref 0.3–1.2)

## 2013-10-08 LAB — FERRITIN: FERRITIN: 26 ng/mL (ref 10–291)

## 2013-12-07 ENCOUNTER — Ambulatory Visit (INDEPENDENT_AMBULATORY_CARE_PROVIDER_SITE_OTHER): Payer: BC Managed Care – PPO | Admitting: Emergency Medicine

## 2013-12-07 VITALS — BP 92/68 | HR 74 | Temp 98.7°F | Resp 16 | Ht 69.5 in | Wt 158.0 lb

## 2013-12-07 DIAGNOSIS — J018 Other acute sinusitis: Secondary | ICD-10-CM

## 2013-12-07 DIAGNOSIS — T371X4A Poisoning by antimycobacterial drugs, undetermined, initial encounter: Secondary | ICD-10-CM

## 2013-12-07 MED ORDER — PSEUDOEPHEDRINE-GUAIFENESIN ER 60-600 MG PO TB12: 1.0000 | ORAL_TABLET | Freq: Two times a day (BID) | ORAL | Status: AC

## 2013-12-07 MED ORDER — AMOXICILLIN-POT CLAVULANATE 875-125 MG PO TABS: 1.0000 | ORAL_TABLET | Freq: Two times a day (BID) | ORAL | Status: AC

## 2013-12-07 NOTE — Progress Notes (Signed)
[facility] [address], [place] [id number] [phone]  Date:  12/07/2013   Name:  Suzanne Barrett   DOB:  04-28-1991   MRN:  [id number]  PCP:  No PCP Per Patient    Chief Complaint: Nasal Congestion, Sore Throat and Back Pain   History of Present Illness:  Suzanne Barrett is a 23 y.o. very pleasant female patient who presents with the following:  Ill since two weeks ago with nasal congestion and purulent drainage. Has no cough.  Now has sore throat and fever and chills with night sweats.  Some low back pain. No dysuria, urgency or frequency   No dyspareunia or bleeding.  No improvement with over the counter medications or other home remedies. . Denies other complaint or health concern today.   There are no active problems to display for this patient.   Past Medical History  Diagnosis Date  . No pertinent past medical history   . Abnormal pap 2009    Past Surgical History  Procedure Laterality Date  . Leep      2008  . Leep  2009    abnormal pap    History  Substance Use Topics  . Smoking status: Never Smoker   . Smokeless tobacco: Former NeurosurgeonUser    Quit date: 10/06/2011  . Alcohol Use: No    Family History  Problem Relation Age of Onset  . Hypertension Mother   . Thyroid disease Mother   . Stroke Maternal Aunt   . Diabetes Maternal Grandmother   . Heart disease Maternal Grandfather     No Known Allergies  Medication list has been reviewed and updated.  Current Outpatient Prescriptions on File Prior to Visit  Medication Sig Dispense Refill  . fluconazole (DIFLUCAN) 150 MG tablet Take 1 tablet (150 mg total) by mouth once.  1 tablet  3  . Homeopathic Products (ZICAM COLD REMEDY PO) Take 1 capsule by mouth daily as needed. Patient was taking this medication for a cold.       . metroNIDAZOLE (METROGEL) 0.75 % vaginal gel Place 1 Applicatorful vaginally at bedtime. Apply one applicatorful to vagina at bedtime for 10 days, then twice a week  for 6 months.  70 g  5  . tinidazole (TINDAMAX) 500 MG tablet Take 4 tablets (2,000 mg total) by mouth daily with breakfast. For two days  8 tablet  2  . valACYclovir (VALTREX) 500 MG tablet Take 500 mg by mouth 2 (two) times daily.       No current facility-administered medications on file prior to visit.    Review of Systems:  As per HPI, otherwise negative.    Physical Examination: Filed Vitals:   12/07/13 1502  BP: 92/68  Pulse: 74  Temp: 98.7 F (37.1 C)  Resp: 16   Filed Vitals:   12/07/13 1502  Height: 5' 9.5" (1.765 m)  Weight: 158 lb (71.668 kg)   Body mass index is 23.01 kg/(m^2). Ideal Body Weight: Weight in (lb) to have BMI = 25: 171.4  GEN: WDWN, NAD, Non-toxic, A & O x 3 HEENT: Atraumatic, Normocephalic. Neck supple. No masses, No LAD. Ears and Nose: No external deformity. CV: RRR, No M/G/R. No JVD. No thrill. No extra heart sounds. PULM: CTA B, no wheezes, crackles, rhonchi. No retractions. No resp. distress. No accessory muscle use. ABD: S, NT, ND, +BS. No rebound. No HSM. EXTR: No c/c/e NEURO Normal gait.  PSYCH: Normally interactive. Conversant. Not depressed or anxious  appearing.  Calm demeanor.    Assessment and Plan: Sinusitis augmentin mucinex d  Signed,  Phillips Odor, MD

## 2013-12-07 NOTE — Patient Instructions (Signed)

## 2015-06-16 IMAGING — CR DG CHEST 2V
2 series · 2 of 2 positions shown · non-contrast
Comparison: None.

CLINICAL DATA: Shortness of breath.  Chest tightness.

EXAM:
CHEST  2 VIEW

[w chest pa]
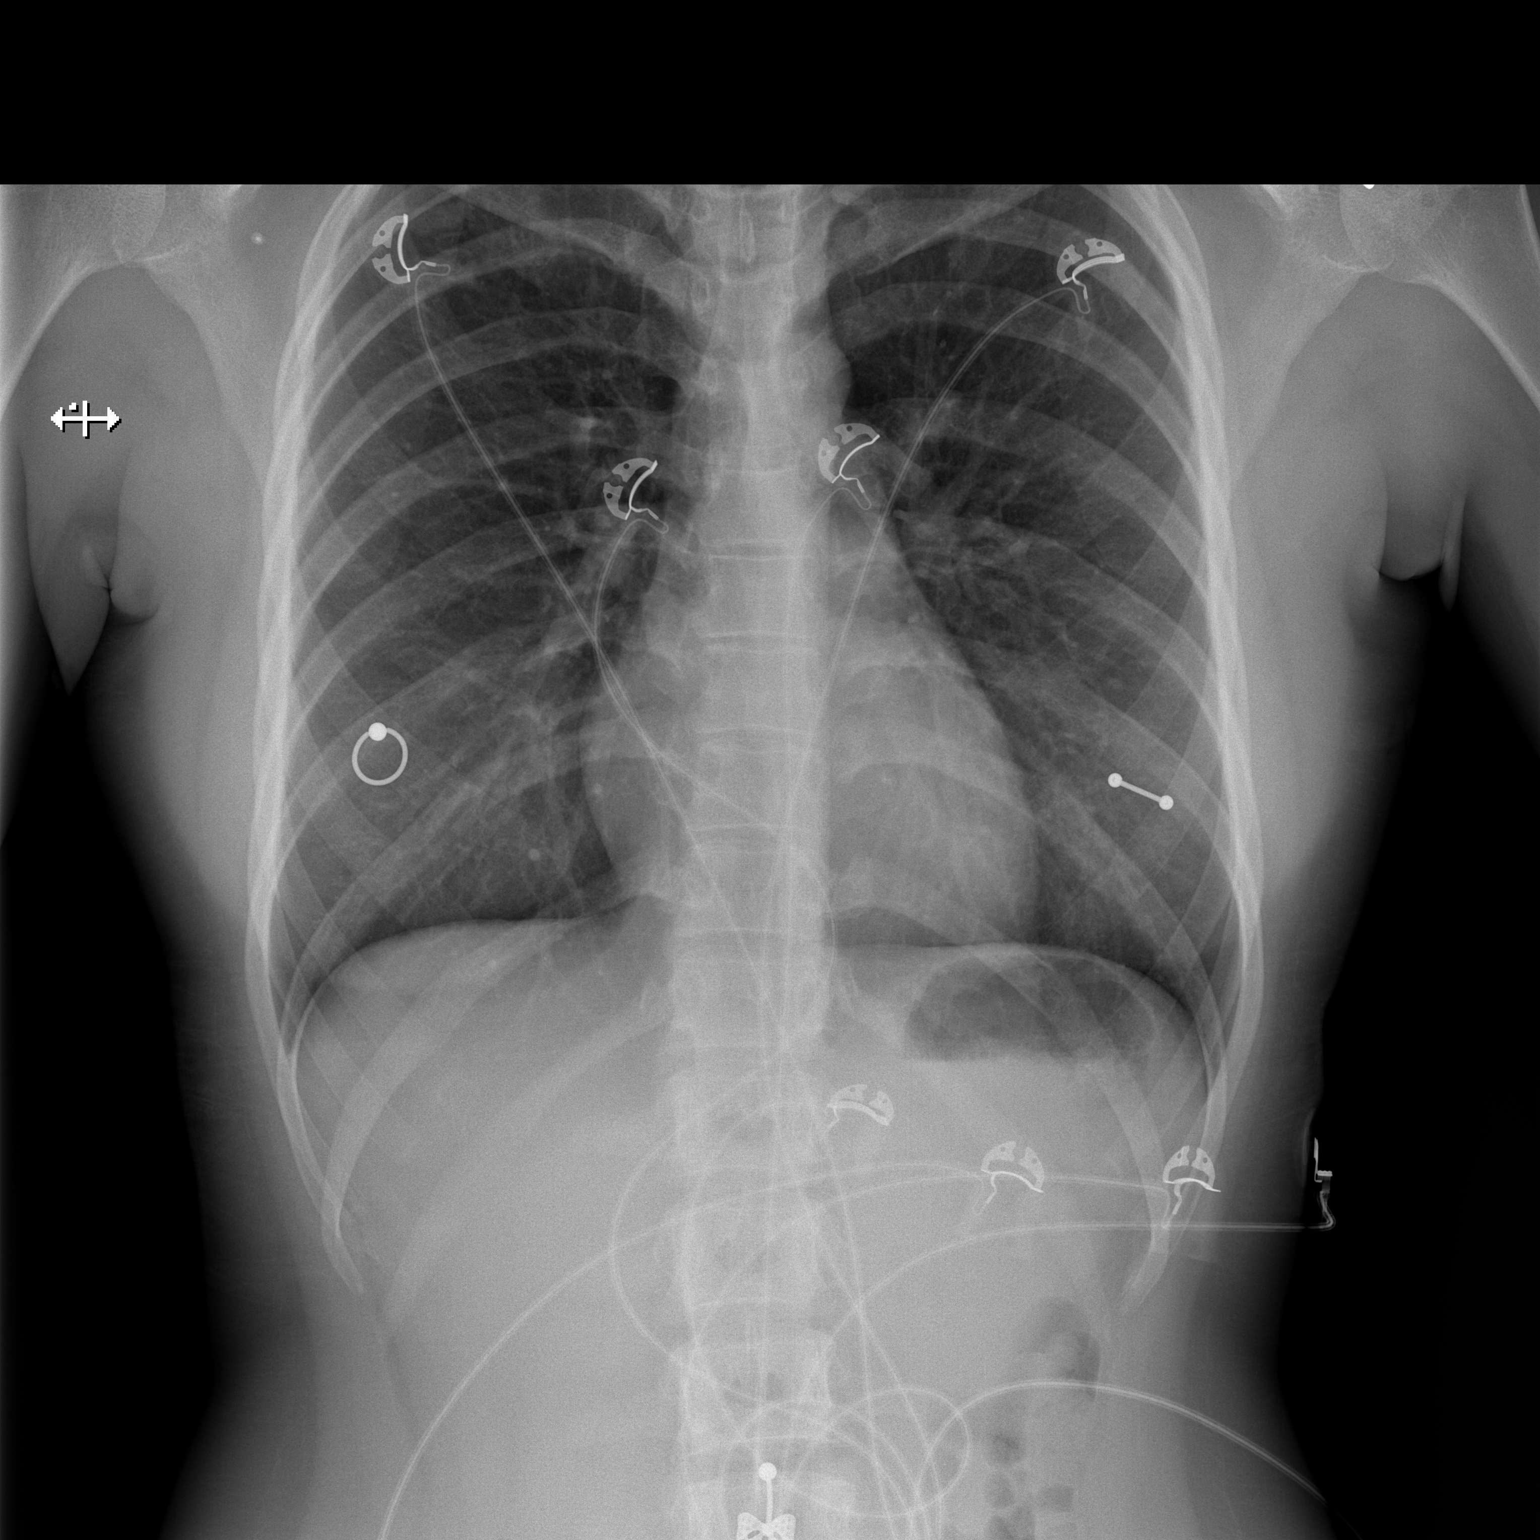

[w chest lat]
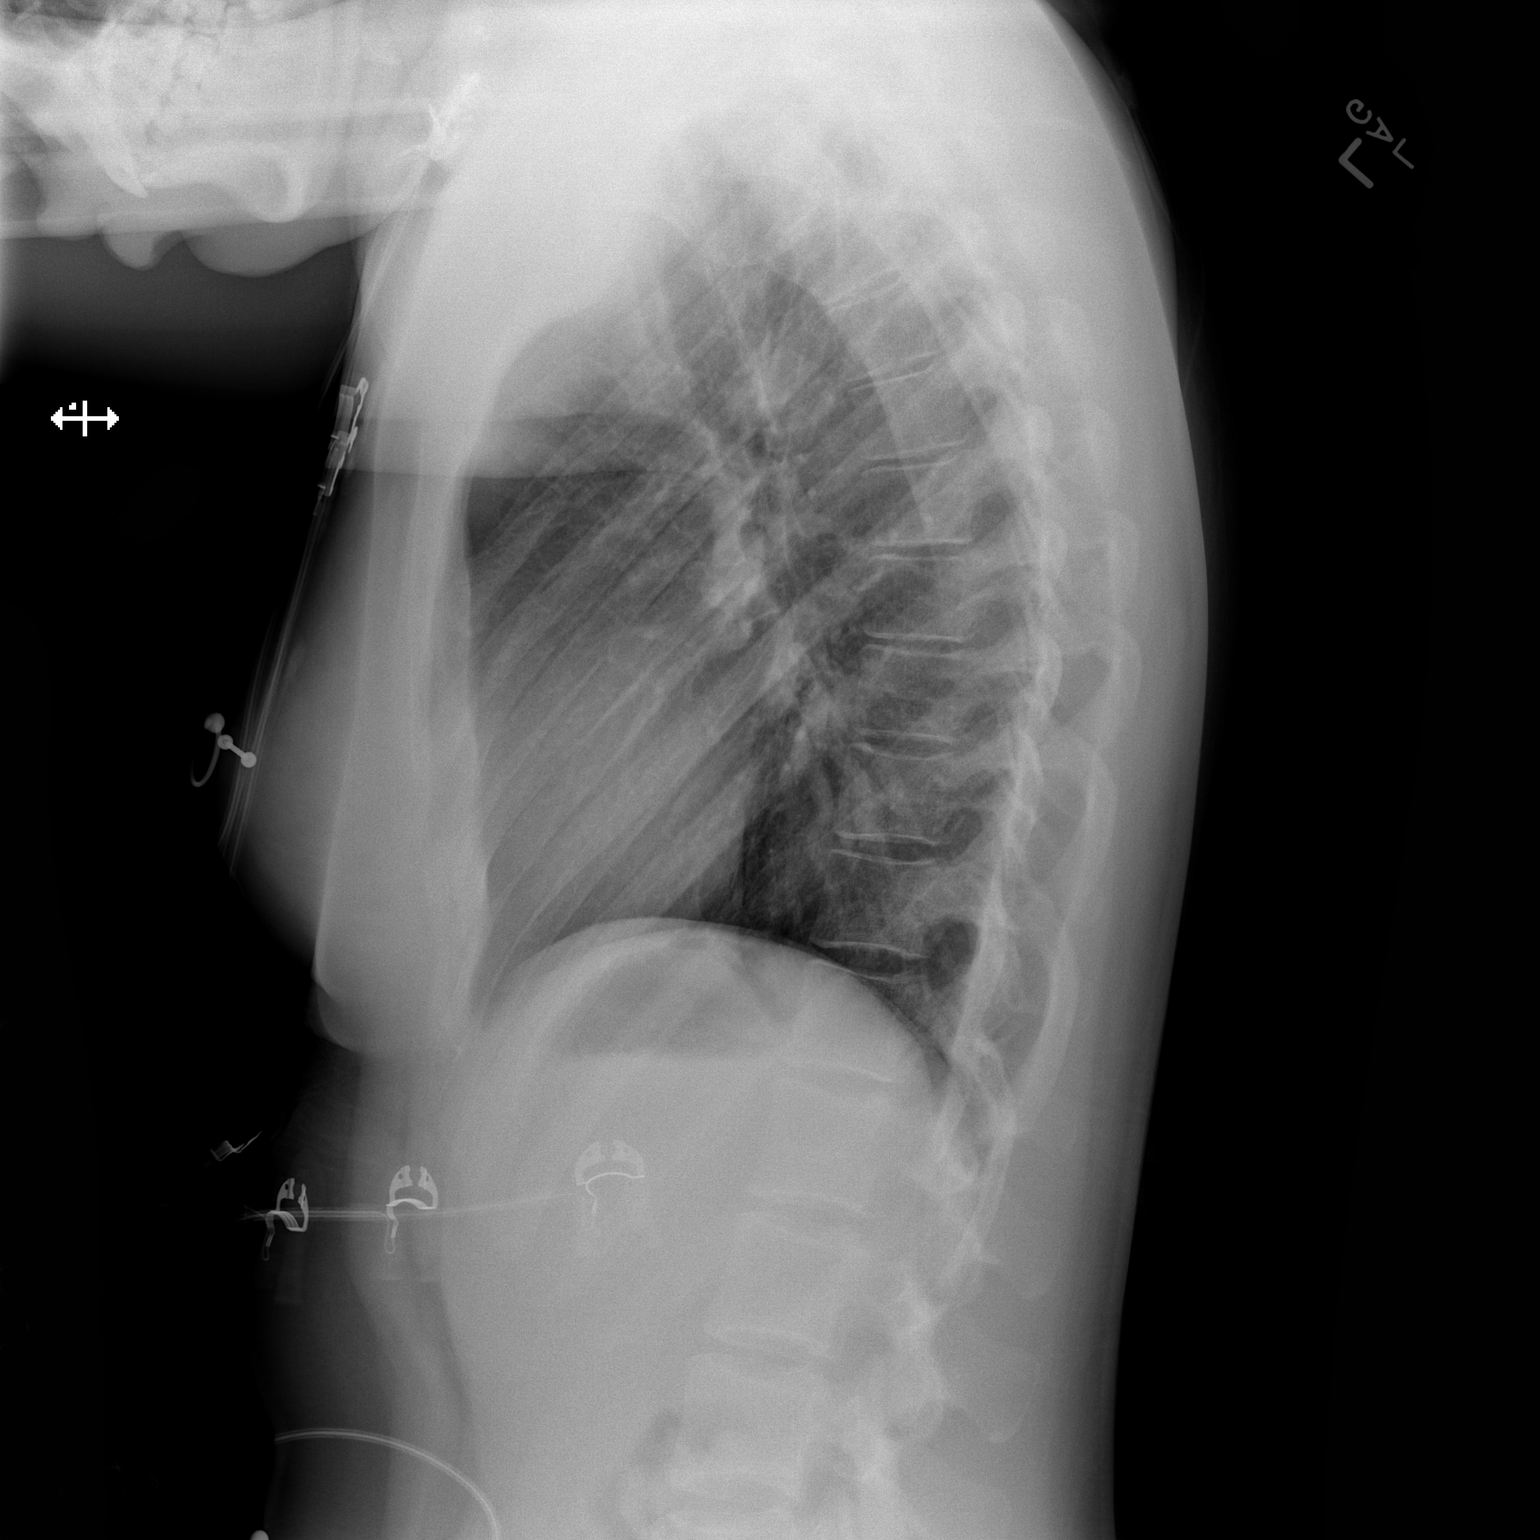

[2 of 2 positions shown; findings below may reference images not displayed]

FINDINGS: The heart size and mediastinal contours are within normal limits.
Both lungs are clear. The visualized skeletal structures are
unremarkable.
IMPRESSION: No active cardiopulmonary disease.

## 2022-06-05 DEATH — deceased
